# Patient Record
Sex: Male | Born: 1985 | Race: White | Hispanic: No | Marital: Single
Health system: Southern US, Community
[De-identification: ages and names within clinical notes are randomized; demographics above are authoritative.]

---

## 2015-08-04 DIAGNOSIS — R300 Dysuria: Secondary | ICD-10-CM | POA: Diagnosis not present

## 2015-08-04 DIAGNOSIS — N390 Urinary tract infection, site not specified: Secondary | ICD-10-CM | POA: Diagnosis not present

## 2015-08-04 DIAGNOSIS — B9689 Other specified bacterial agents as the cause of diseases classified elsewhere: Secondary | ICD-10-CM | POA: Diagnosis not present

## 2016-01-31 DIAGNOSIS — S5292XA Unspecified fracture of left forearm, initial encounter for closed fracture: Secondary | ICD-10-CM | POA: Diagnosis not present

## 2016-01-31 DIAGNOSIS — S52502A Unspecified fracture of the lower end of left radius, initial encounter for closed fracture: Secondary | ICD-10-CM | POA: Diagnosis not present

## 2016-02-06 DIAGNOSIS — S52502D Unspecified fracture of the lower end of left radius, subsequent encounter for closed fracture with routine healing: Secondary | ICD-10-CM | POA: Diagnosis not present

## 2016-02-13 DIAGNOSIS — S52502D Unspecified fracture of the lower end of left radius, subsequent encounter for closed fracture with routine healing: Secondary | ICD-10-CM | POA: Diagnosis not present

## 2016-02-28 DIAGNOSIS — S52502D Unspecified fracture of the lower end of left radius, subsequent encounter for closed fracture with routine healing: Secondary | ICD-10-CM | POA: Diagnosis not present

## 2016-03-12 DIAGNOSIS — M25532 Pain in left wrist: Secondary | ICD-10-CM | POA: Diagnosis not present

## 2016-03-12 DIAGNOSIS — S62002D Unspecified fracture of navicular [scaphoid] bone of left wrist, subsequent encounter for fracture with routine healing: Secondary | ICD-10-CM | POA: Diagnosis not present

## 2016-03-12 DIAGNOSIS — R531 Weakness: Secondary | ICD-10-CM | POA: Diagnosis not present

## 2016-03-26 DIAGNOSIS — S52502A Unspecified fracture of the lower end of left radius, initial encounter for closed fracture: Secondary | ICD-10-CM | POA: Diagnosis not present

## 2016-04-28 DIAGNOSIS — R5383 Other fatigue: Secondary | ICD-10-CM | POA: Diagnosis not present

## 2016-05-24 DIAGNOSIS — Z0000 Encounter for general adult medical examination without abnormal findings: Secondary | ICD-10-CM | POA: Diagnosis not present

## 2016-05-24 DIAGNOSIS — Z23 Encounter for immunization: Secondary | ICD-10-CM | POA: Diagnosis not present

## 2017-02-26 ENCOUNTER — Other Ambulatory Visit: Payer: Self-pay | Admitting: Family Medicine

## 2017-02-26 DIAGNOSIS — S76312A Strain of muscle, fascia and tendon of the posterior muscle group at thigh level, left thigh, initial encounter: Secondary | ICD-10-CM | POA: Diagnosis not present

## 2017-02-28 ENCOUNTER — Ambulatory Visit
Admission: RE | Admit: 2017-02-28 | Discharge: 2017-02-28 | Disposition: A | Discharge status: Home / Self Care | Payer: Federal, State, Local not specified - PPO | Source: Ambulatory Visit | Attending: Family Medicine | Admitting: Family Medicine

## 2017-02-28 DIAGNOSIS — S76312A Strain of muscle, fascia and tendon of the posterior muscle group at thigh level, left thigh, initial encounter: Secondary | ICD-10-CM

## 2017-02-28 DIAGNOSIS — S8012XA Contusion of left lower leg, initial encounter: Secondary | ICD-10-CM | POA: Diagnosis not present

## 2017-04-11 DIAGNOSIS — S76312A Strain of muscle, fascia and tendon of the posterior muscle group at thigh level, left thigh, initial encounter: Secondary | ICD-10-CM | POA: Diagnosis not present

## 2018-01-30 DIAGNOSIS — M25551 Pain in right hip: Secondary | ICD-10-CM | POA: Diagnosis not present

## 2018-01-30 DIAGNOSIS — M6281 Muscle weakness (generalized): Secondary | ICD-10-CM | POA: Diagnosis not present

## 2018-01-30 DIAGNOSIS — M25552 Pain in left hip: Secondary | ICD-10-CM | POA: Diagnosis not present

## 2018-02-04 DIAGNOSIS — M6281 Muscle weakness (generalized): Secondary | ICD-10-CM | POA: Diagnosis not present

## 2018-02-04 DIAGNOSIS — M25552 Pain in left hip: Secondary | ICD-10-CM | POA: Diagnosis not present

## 2018-02-04 DIAGNOSIS — M25551 Pain in right hip: Secondary | ICD-10-CM | POA: Diagnosis not present

## 2018-02-06 DIAGNOSIS — M25552 Pain in left hip: Secondary | ICD-10-CM | POA: Diagnosis not present

## 2018-02-06 DIAGNOSIS — M6281 Muscle weakness (generalized): Secondary | ICD-10-CM | POA: Diagnosis not present

## 2018-02-06 DIAGNOSIS — M25551 Pain in right hip: Secondary | ICD-10-CM | POA: Diagnosis not present

## 2018-02-11 DIAGNOSIS — M25552 Pain in left hip: Secondary | ICD-10-CM | POA: Diagnosis not present

## 2018-02-11 DIAGNOSIS — M6281 Muscle weakness (generalized): Secondary | ICD-10-CM | POA: Diagnosis not present

## 2018-02-11 DIAGNOSIS — M25551 Pain in right hip: Secondary | ICD-10-CM | POA: Diagnosis not present

## 2018-02-14 DIAGNOSIS — M25552 Pain in left hip: Secondary | ICD-10-CM | POA: Diagnosis not present

## 2018-02-14 DIAGNOSIS — M25551 Pain in right hip: Secondary | ICD-10-CM | POA: Diagnosis not present

## 2018-02-14 DIAGNOSIS — M6281 Muscle weakness (generalized): Secondary | ICD-10-CM | POA: Diagnosis not present

## 2018-02-21 DIAGNOSIS — M6281 Muscle weakness (generalized): Secondary | ICD-10-CM | POA: Diagnosis not present

## 2018-02-21 DIAGNOSIS — M25552 Pain in left hip: Secondary | ICD-10-CM | POA: Diagnosis not present

## 2018-02-21 DIAGNOSIS — M25551 Pain in right hip: Secondary | ICD-10-CM | POA: Diagnosis not present

## 2018-02-24 ENCOUNTER — Other Ambulatory Visit: Payer: Self-pay | Admitting: Family Medicine

## 2018-02-24 ENCOUNTER — Ambulatory Visit
Admission: RE | Admit: 2018-02-24 | Discharge: 2018-02-24 | Disposition: A | Discharge status: Home / Self Care | Payer: Federal, State, Local not specified - PPO | Source: Ambulatory Visit | Attending: Family Medicine | Admitting: Family Medicine

## 2018-02-24 DIAGNOSIS — M545 Low back pain, unspecified: Secondary | ICD-10-CM

## 2018-02-24 DIAGNOSIS — M48061 Spinal stenosis, lumbar region without neurogenic claudication: Secondary | ICD-10-CM | POA: Diagnosis not present

## 2018-02-28 DIAGNOSIS — M25552 Pain in left hip: Secondary | ICD-10-CM | POA: Diagnosis not present

## 2018-02-28 DIAGNOSIS — M25551 Pain in right hip: Secondary | ICD-10-CM | POA: Diagnosis not present

## 2018-02-28 DIAGNOSIS — M6281 Muscle weakness (generalized): Secondary | ICD-10-CM | POA: Diagnosis not present

## 2018-03-03 DIAGNOSIS — M6281 Muscle weakness (generalized): Secondary | ICD-10-CM | POA: Diagnosis not present

## 2018-03-03 DIAGNOSIS — M25552 Pain in left hip: Secondary | ICD-10-CM | POA: Diagnosis not present

## 2018-03-03 DIAGNOSIS — M545 Low back pain: Secondary | ICD-10-CM | POA: Diagnosis not present

## 2018-03-07 DIAGNOSIS — M25552 Pain in left hip: Secondary | ICD-10-CM | POA: Diagnosis not present

## 2018-03-07 DIAGNOSIS — M545 Low back pain: Secondary | ICD-10-CM | POA: Diagnosis not present

## 2018-03-07 DIAGNOSIS — M25551 Pain in right hip: Secondary | ICD-10-CM | POA: Diagnosis not present

## 2018-03-07 DIAGNOSIS — M6281 Muscle weakness (generalized): Secondary | ICD-10-CM | POA: Diagnosis not present

## 2018-03-12 DIAGNOSIS — M25551 Pain in right hip: Secondary | ICD-10-CM | POA: Diagnosis not present

## 2018-03-12 DIAGNOSIS — M6281 Muscle weakness (generalized): Secondary | ICD-10-CM | POA: Diagnosis not present

## 2018-03-12 DIAGNOSIS — M545 Low back pain: Secondary | ICD-10-CM | POA: Diagnosis not present

## 2018-03-12 DIAGNOSIS — M25552 Pain in left hip: Secondary | ICD-10-CM | POA: Diagnosis not present

## 2018-03-14 DIAGNOSIS — M25552 Pain in left hip: Secondary | ICD-10-CM | POA: Diagnosis not present

## 2018-03-14 DIAGNOSIS — M545 Low back pain: Secondary | ICD-10-CM | POA: Diagnosis not present

## 2018-03-14 DIAGNOSIS — M6281 Muscle weakness (generalized): Secondary | ICD-10-CM | POA: Diagnosis not present

## 2018-03-14 DIAGNOSIS — M25551 Pain in right hip: Secondary | ICD-10-CM | POA: Diagnosis not present

## 2018-03-24 DIAGNOSIS — M6281 Muscle weakness (generalized): Secondary | ICD-10-CM | POA: Diagnosis not present

## 2018-03-24 DIAGNOSIS — M25552 Pain in left hip: Secondary | ICD-10-CM | POA: Diagnosis not present

## 2018-03-24 DIAGNOSIS — M545 Low back pain: Secondary | ICD-10-CM | POA: Diagnosis not present

## 2018-03-27 DIAGNOSIS — M545 Low back pain: Secondary | ICD-10-CM | POA: Diagnosis not present

## 2018-03-27 DIAGNOSIS — M25552 Pain in left hip: Secondary | ICD-10-CM | POA: Diagnosis not present

## 2018-03-27 DIAGNOSIS — M6281 Muscle weakness (generalized): Secondary | ICD-10-CM | POA: Diagnosis not present

## 2018-03-31 DIAGNOSIS — M25552 Pain in left hip: Secondary | ICD-10-CM | POA: Diagnosis not present

## 2018-03-31 DIAGNOSIS — M545 Low back pain: Secondary | ICD-10-CM | POA: Diagnosis not present

## 2018-03-31 DIAGNOSIS — M25551 Pain in right hip: Secondary | ICD-10-CM | POA: Diagnosis not present

## 2018-03-31 DIAGNOSIS — M6281 Muscle weakness (generalized): Secondary | ICD-10-CM | POA: Diagnosis not present

## 2018-04-02 DIAGNOSIS — M6281 Muscle weakness (generalized): Secondary | ICD-10-CM | POA: Diagnosis not present

## 2018-04-02 DIAGNOSIS — M25551 Pain in right hip: Secondary | ICD-10-CM | POA: Diagnosis not present

## 2018-04-02 DIAGNOSIS — M545 Low back pain: Secondary | ICD-10-CM | POA: Diagnosis not present

## 2018-04-02 DIAGNOSIS — M25552 Pain in left hip: Secondary | ICD-10-CM | POA: Diagnosis not present

## 2018-04-07 DIAGNOSIS — M6281 Muscle weakness (generalized): Secondary | ICD-10-CM | POA: Diagnosis not present

## 2018-04-07 DIAGNOSIS — M25552 Pain in left hip: Secondary | ICD-10-CM | POA: Diagnosis not present

## 2018-04-07 DIAGNOSIS — M545 Low back pain: Secondary | ICD-10-CM | POA: Diagnosis not present

## 2018-04-21 DIAGNOSIS — M545 Low back pain: Secondary | ICD-10-CM | POA: Diagnosis not present

## 2018-04-21 DIAGNOSIS — M25552 Pain in left hip: Secondary | ICD-10-CM | POA: Diagnosis not present

## 2018-04-21 DIAGNOSIS — M6281 Muscle weakness (generalized): Secondary | ICD-10-CM | POA: Diagnosis not present

## 2018-04-28 DIAGNOSIS — M6281 Muscle weakness (generalized): Secondary | ICD-10-CM | POA: Diagnosis not present

## 2018-04-28 DIAGNOSIS — M545 Low back pain: Secondary | ICD-10-CM | POA: Diagnosis not present

## 2018-04-28 DIAGNOSIS — M25551 Pain in right hip: Secondary | ICD-10-CM | POA: Diagnosis not present

## 2018-04-28 DIAGNOSIS — M25552 Pain in left hip: Secondary | ICD-10-CM | POA: Diagnosis not present

## 2018-05-01 DIAGNOSIS — M545 Low back pain: Secondary | ICD-10-CM | POA: Diagnosis not present

## 2018-05-01 DIAGNOSIS — M5137 Other intervertebral disc degeneration, lumbosacral region: Secondary | ICD-10-CM | POA: Diagnosis not present

## 2018-05-02 DIAGNOSIS — M6281 Muscle weakness (generalized): Secondary | ICD-10-CM | POA: Diagnosis not present

## 2018-05-02 DIAGNOSIS — M545 Low back pain: Secondary | ICD-10-CM | POA: Diagnosis not present

## 2018-05-02 DIAGNOSIS — M25552 Pain in left hip: Secondary | ICD-10-CM | POA: Diagnosis not present

## 2018-05-19 DIAGNOSIS — M25552 Pain in left hip: Secondary | ICD-10-CM | POA: Diagnosis not present

## 2018-05-19 DIAGNOSIS — M545 Low back pain: Secondary | ICD-10-CM | POA: Diagnosis not present

## 2018-05-19 DIAGNOSIS — M6281 Muscle weakness (generalized): Secondary | ICD-10-CM | POA: Diagnosis not present

## 2018-05-29 DIAGNOSIS — M545 Low back pain: Secondary | ICD-10-CM | POA: Diagnosis not present

## 2018-05-29 DIAGNOSIS — M25552 Pain in left hip: Secondary | ICD-10-CM | POA: Diagnosis not present

## 2018-05-29 DIAGNOSIS — M6281 Muscle weakness (generalized): Secondary | ICD-10-CM | POA: Diagnosis not present

## 2018-06-09 DIAGNOSIS — M25551 Pain in right hip: Secondary | ICD-10-CM | POA: Diagnosis not present

## 2018-06-09 DIAGNOSIS — M25552 Pain in left hip: Secondary | ICD-10-CM | POA: Diagnosis not present

## 2018-06-09 DIAGNOSIS — M6281 Muscle weakness (generalized): Secondary | ICD-10-CM | POA: Diagnosis not present

## 2018-06-09 DIAGNOSIS — M545 Low back pain: Secondary | ICD-10-CM | POA: Diagnosis not present

## 2018-06-20 DIAGNOSIS — M6281 Muscle weakness (generalized): Secondary | ICD-10-CM | POA: Diagnosis not present

## 2018-06-20 DIAGNOSIS — M545 Low back pain: Secondary | ICD-10-CM | POA: Diagnosis not present

## 2018-06-20 DIAGNOSIS — M25552 Pain in left hip: Secondary | ICD-10-CM | POA: Diagnosis not present

## 2018-06-20 DIAGNOSIS — M25551 Pain in right hip: Secondary | ICD-10-CM | POA: Diagnosis not present

## 2018-06-30 DIAGNOSIS — M6281 Muscle weakness (generalized): Secondary | ICD-10-CM | POA: Diagnosis not present

## 2018-06-30 DIAGNOSIS — M25552 Pain in left hip: Secondary | ICD-10-CM | POA: Diagnosis not present

## 2018-06-30 DIAGNOSIS — M545 Low back pain: Secondary | ICD-10-CM | POA: Diagnosis not present

## 2018-06-30 DIAGNOSIS — M25551 Pain in right hip: Secondary | ICD-10-CM | POA: Diagnosis not present

## 2018-12-09 DIAGNOSIS — B078 Other viral warts: Secondary | ICD-10-CM | POA: Diagnosis not present

## 2018-12-09 DIAGNOSIS — A630 Anogenital (venereal) warts: Secondary | ICD-10-CM | POA: Diagnosis not present

## 2019-04-01 DIAGNOSIS — Z20828 Contact with and (suspected) exposure to other viral communicable diseases: Secondary | ICD-10-CM | POA: Diagnosis not present

## 2019-12-23 IMAGING — CR DG LUMBAR SPINE COMPLETE 4+V
5 series · 5 of 5 positions shown · non-contrast
Comparison: None.

CLINICAL DATA: Acute left-sided low back pain.

EXAM:
LUMBAR SPINE - COMPLETE 4+ VIEW

[w lumbar spine ap]
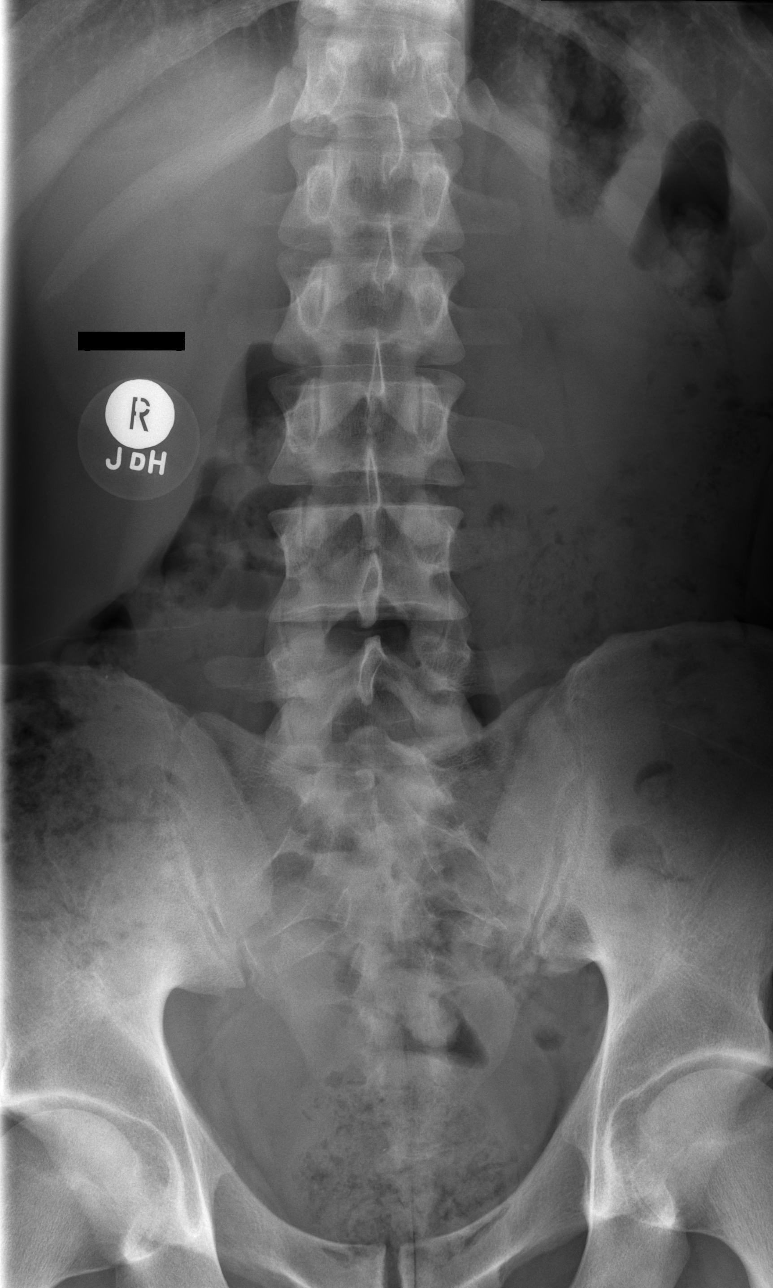

[w lumbar spine obl (1 of 2)]
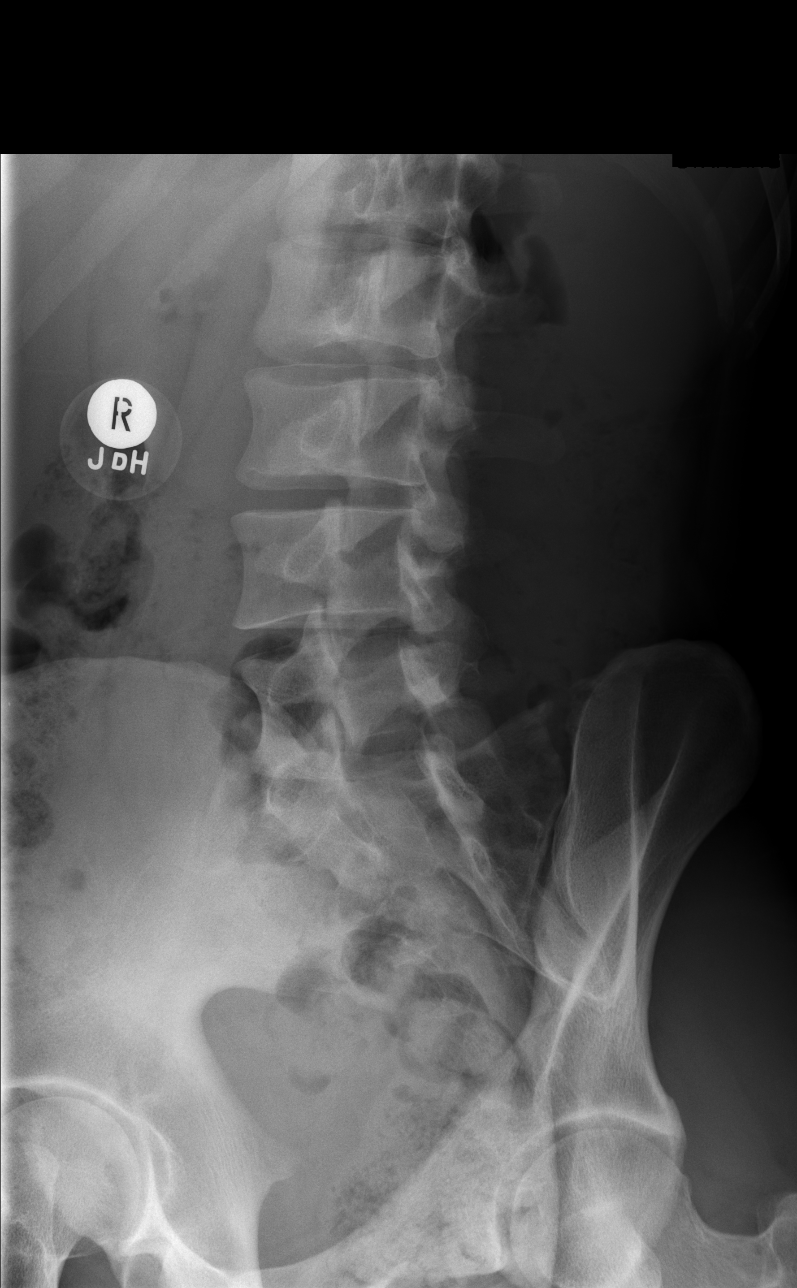

[w lumbar spine obl (2 of 2)]
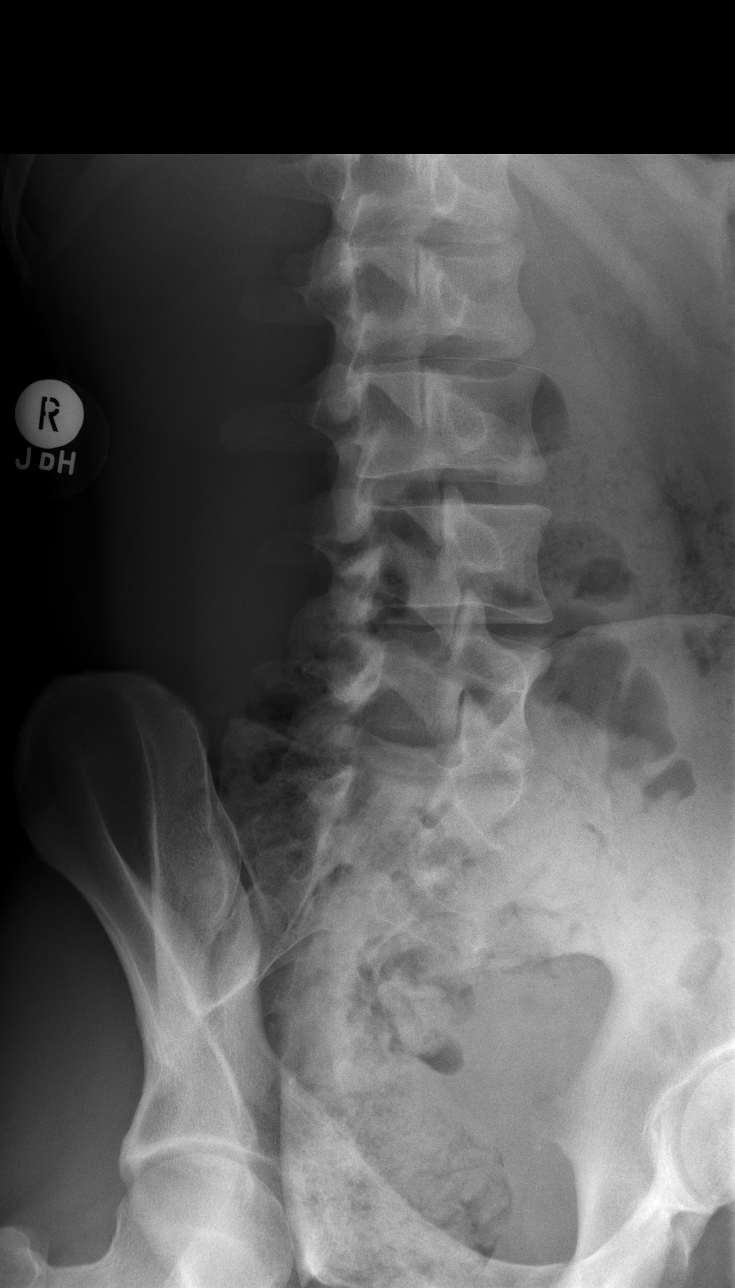

[w lumbar spine lat]
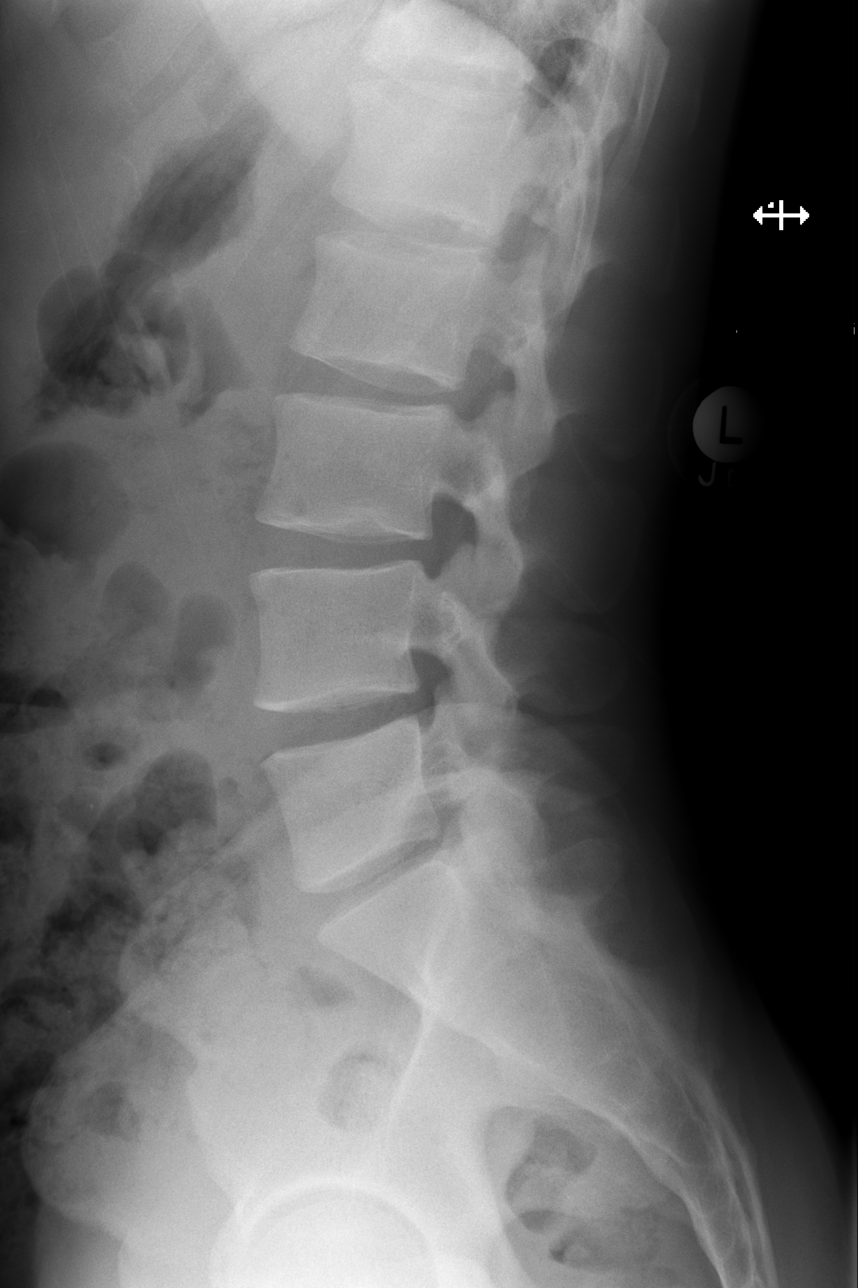

[w lumbar l-5 s-1 spot]
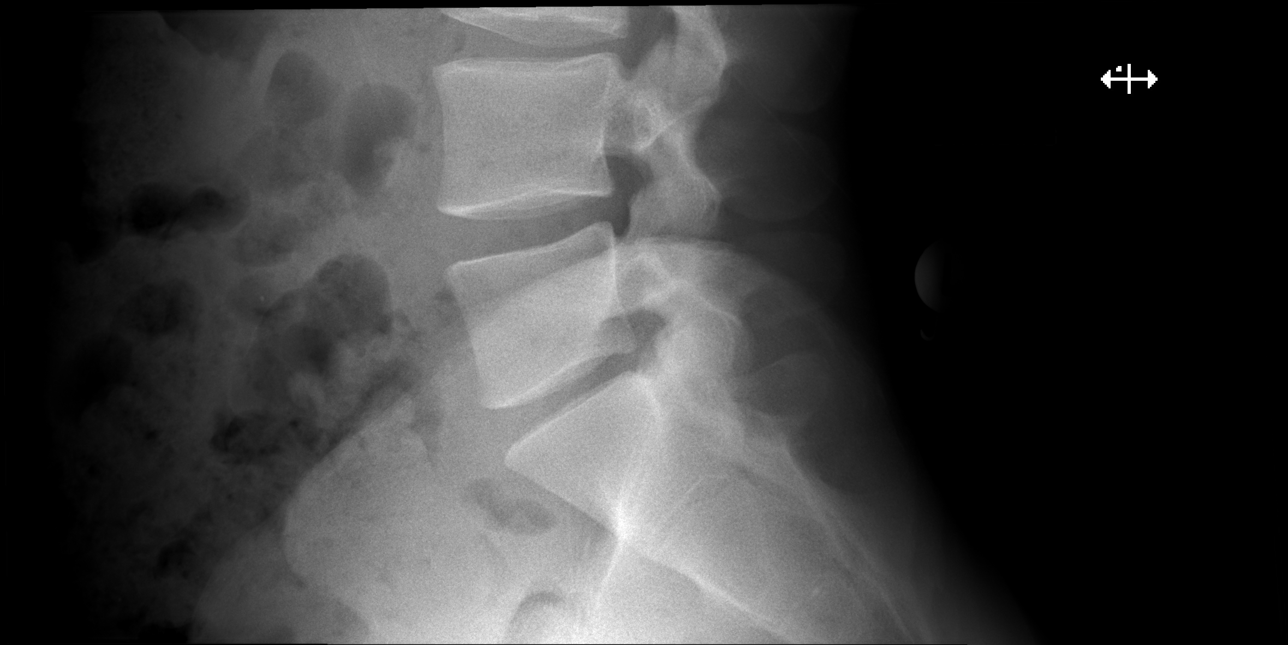

[5 of 5 positions shown; findings below may reference images not displayed]

FINDINGS: There is no evidence of lumbar spine fracture. Alignment is normal.
Minimal narrowing of the L5-S1 disc space. The other disc spaces are
normal. Facet arthritis.
IMPRESSION: Slight narrowing of the L5-S1 disc space.  Otherwise, negative exam.

## 2020-02-26 ENCOUNTER — Ambulatory Visit: Payer: Federal, State, Local not specified - PPO | Attending: Internal Medicine

## 2020-02-26 DIAGNOSIS — Z23 Encounter for immunization: Secondary | ICD-10-CM

## 2020-02-26 NOTE — Progress Notes (Signed)
° °  [facility]  Name:  John Burgess    MRN: [id number] DOB: 1985/11/23  02/26/2020  [name] was observed post Covid-19 immunization for 15 minutes without incident. He was provided with Vaccine Information Sheet and instruction to access the V-Safe system.   [name] was instructed to call 911 with any severe reactions post vaccine:  Difficulty breathing   Swelling of face and throat   A fast heartbeat   A bad rash all over body   Dizziness and weakness   Immunizations Administered    Name Date Dose VIS Date Route   JANSSEN COVID-19 VACCINE 02/26/2020 12:01 PM 0.5 mL 02/10/2020 Intramuscular   Manufacturer: [employer]   Lot: [id number]   NDC: 56812-751-70
